# Patient Record
Sex: Male | Born: 1995 | Race: Black or African American | Hispanic: No | Marital: Single | Smoking: Current every day smoker
Health system: Southern US, Community
[De-identification: ages and names within clinical notes are randomized; demographics above are authoritative.]

---

## 2016-09-25 ENCOUNTER — Encounter (HOSPITAL_COMMUNITY): Admission: EM | Disposition: A | Discharge status: Home / Self Care | Payer: Self-pay | Source: Home / Self Care

## 2016-09-25 ENCOUNTER — Inpatient Hospital Stay (HOSPITAL_COMMUNITY): Payer: Medicaid Other | Admitting: Anesthesiology

## 2016-09-25 ENCOUNTER — Encounter (HOSPITAL_COMMUNITY): Payer: Self-pay | Admitting: *Deleted

## 2016-09-25 ENCOUNTER — Inpatient Hospital Stay (HOSPITAL_COMMUNITY): Payer: Medicaid Other

## 2016-09-25 ENCOUNTER — Inpatient Hospital Stay (HOSPITAL_COMMUNITY)
Admission: EM | Admit: 2016-09-25 | Discharge: 2016-09-26 | DRG: 482 | Disposition: A | Discharge status: Home / Self Care | Payer: Medicaid Other | Attending: General Surgery | Admitting: General Surgery

## 2016-09-25 ENCOUNTER — Emergency Department (HOSPITAL_COMMUNITY): Payer: Medicaid Other

## 2016-09-25 DIAGNOSIS — T148XXA Other injury of unspecified body region, initial encounter: Secondary | ICD-10-CM

## 2016-09-25 DIAGNOSIS — S72402B Unspecified fracture of lower end of left femur, initial encounter for open fracture type I or II: Secondary | ICD-10-CM | POA: Diagnosis present

## 2016-09-25 DIAGNOSIS — R2681 Unsteadiness on feet: Secondary | ICD-10-CM

## 2016-09-25 DIAGNOSIS — F17200 Nicotine dependence, unspecified, uncomplicated: Secondary | ICD-10-CM | POA: Diagnosis present

## 2016-09-25 DIAGNOSIS — M79605 Pain in left leg: Secondary | ICD-10-CM | POA: Diagnosis present

## 2016-09-25 DIAGNOSIS — W3400XA Accidental discharge from unspecified firearms or gun, initial encounter: Secondary | ICD-10-CM

## 2016-09-25 DIAGNOSIS — S71132A Puncture wound without foreign body, left thigh, initial encounter: Secondary | ICD-10-CM

## 2016-09-25 DIAGNOSIS — S7292XB Unspecified fracture of left femur, initial encounter for open fracture type I or II: Secondary | ICD-10-CM | POA: Diagnosis present

## 2016-09-25 DIAGNOSIS — S7292XA Unspecified fracture of left femur, initial encounter for closed fracture: Secondary | ICD-10-CM

## 2016-09-25 HISTORY — PX: FEMUR IM NAIL: SHX1597

## 2016-09-25 LAB — CBC WITH DIFFERENTIAL/PLATELET
BASOS PCT: 0 %
Basophils Absolute: 0 10*3/uL (ref 0.0–0.1)
EOS ABS: 0.1 10*3/uL (ref 0.0–0.7)
EOS PCT: 0 %
HCT: 43.1 % (ref 39.0–52.0)
HEMOGLOBIN: 14.8 g/dL (ref 13.0–17.0)
Lymphocytes Relative: 41 %
Lymphs Abs: 5.3 10*3/uL — ABNORMAL HIGH (ref 0.7–4.0)
MCH: 34.3 pg — ABNORMAL HIGH (ref 26.0–34.0)
MCHC: 34.3 g/dL (ref 30.0–36.0)
MCV: 100 fL (ref 78.0–100.0)
Monocytes Absolute: 0.8 10*3/uL (ref 0.1–1.0)
Monocytes Relative: 6 %
NEUTROS PCT: 53 %
Neutro Abs: 6.8 10*3/uL (ref 1.7–7.7)
PLATELETS: 191 10*3/uL (ref 150–400)
RBC: 4.31 MIL/uL (ref 4.22–5.81)
RDW: 11.6 % (ref 11.5–15.5)
WBC: 13 10*3/uL — AB (ref 4.0–10.5)

## 2016-09-25 LAB — CBC
HEMATOCRIT: 39.8 % (ref 39.0–52.0)
Hemoglobin: 13.7 g/dL (ref 13.0–17.0)
MCH: 34.1 pg — ABNORMAL HIGH (ref 26.0–34.0)
MCHC: 34.4 g/dL (ref 30.0–36.0)
MCV: 99 fL (ref 78.0–100.0)
PLATELETS: 166 10*3/uL (ref 150–400)
RBC: 4.02 MIL/uL — AB (ref 4.22–5.81)
RDW: 11.6 % (ref 11.5–15.5)
WBC: 20.4 10*3/uL — AB (ref 4.0–10.5)

## 2016-09-25 LAB — PREPARE FRESH FROZEN PLASMA
UNIT DIVISION: 0
UNIT DIVISION: 0

## 2016-09-25 LAB — TYPE AND SCREEN
ABO/RH(D): O POS
Antibody Screen: NEGATIVE
UNIT DIVISION: 0
Unit division: 0

## 2016-09-25 LAB — BASIC METABOLIC PANEL
Anion gap: 11 (ref 5–15)
Anion gap: 10 (ref 5–15)
BUN: <5 mg/dL — ABNORMAL LOW (ref 6–20)
BUN: 7 mg/dL (ref 6–20)
CALCIUM: 9.1 mg/dL (ref 8.9–10.3)
CHLORIDE: 103 mmol/L (ref 101–111)
CO2: 25 mmol/L (ref 22–32)
CO2: 25 mmol/L (ref 22–32)
CREATININE: 1.16 mg/dL (ref 0.61–1.24)
Calcium: 9.2 mg/dL (ref 8.9–10.3)
Chloride: 104 mmol/L (ref 101–111)
Creatinine, Ser: 1.03 mg/dL (ref 0.61–1.24)
GLUCOSE: 108 mg/dL — AB (ref 65–99)
Glucose, Bld: 152 mg/dL — ABNORMAL HIGH (ref 65–99)
POTASSIUM: 3 mmol/L — AB (ref 3.5–5.1)
POTASSIUM: 3.6 mmol/L (ref 3.5–5.1)
SODIUM: 139 mmol/L (ref 135–145)
Sodium: 139 mmol/L (ref 135–145)

## 2016-09-25 LAB — ABO/RH: ABO/RH(D): O POS

## 2016-09-25 SURGERY — INSERTION, INTRAMEDULLARY ROD, FEMUR, RETROGRADE
Anesthesia: Regional | Site: Leg Upper | Laterality: Left

## 2016-09-25 MED ORDER — LIDOCAINE HCL (CARDIAC) 20 MG/ML IV SOLN
INTRAVENOUS | Status: DC | PRN
Start: 2016-09-25 — End: 2016-09-25
  Administered 2016-09-25: 60 mg via INTRAVENOUS

## 2016-09-25 MED ORDER — ACETAMINOPHEN 160 MG/5ML PO SOLN: 325.0000 mg | ORAL | Status: DC | PRN

## 2016-09-25 MED ORDER — FENTANYL CITRATE (PF) 100 MCG/2ML IJ SOLN
INTRAMUSCULAR | Status: AC
  Filled 2016-09-25: qty 4

## 2016-09-25 MED ORDER — SUGAMMADEX SODIUM 200 MG/2ML IV SOLN
INTRAVENOUS | Status: AC
  Filled 2016-09-25: qty 2

## 2016-09-25 MED ORDER — FAMOTIDINE IN NACL 20-0.9 MG/50ML-% IV SOLN
20.0000 mg | Freq: Every day | INTRAVENOUS | Status: DC
  Filled 2016-09-25 (×2): qty 50

## 2016-09-25 MED ORDER — ONDANSETRON HCL 4 MG/2ML IJ SOLN
INTRAMUSCULAR | Status: AC
  Filled 2016-09-25: qty 2

## 2016-09-25 MED ORDER — CEFAZOLIN IN D5W 1 GM/50ML IV SOLN
1.0000 g | Freq: Four times a day (QID) | INTRAVENOUS | Status: AC
  Administered 2016-09-25 – 2016-09-26 (×3): 1 g via INTRAVENOUS
  Filled 2016-09-25 (×3): qty 50

## 2016-09-25 MED ORDER — ONDANSETRON HCL 4 MG/2ML IJ SOLN
INTRAMUSCULAR | Status: DC | PRN
Start: 2016-09-25 — End: 2016-09-25
  Administered 2016-09-25: 4 mg via INTRAVENOUS

## 2016-09-25 MED ORDER — MIDAZOLAM HCL 2 MG/2ML IJ SOLN
INTRAMUSCULAR | Status: DC | PRN
  Administered 2016-09-25: 2 mg via INTRAVENOUS

## 2016-09-25 MED ORDER — ACETAMINOPHEN 325 MG PO TABS: 325.0000 mg | ORAL_TABLET | ORAL | Status: DC | PRN

## 2016-09-25 MED ORDER — SODIUM CHLORIDE 0.9 % IV SOLN
INTRAVENOUS | Status: DC
  Administered 2016-09-25 (×2): via INTRAVENOUS

## 2016-09-25 MED ORDER — ONDANSETRON HCL 4 MG PO TABS: 4.0000 mg | ORAL_TABLET | Freq: Four times a day (QID) | ORAL | Status: DC | PRN

## 2016-09-25 MED ORDER — METHOCARBAMOL 1000 MG/10ML IJ SOLN
500.0000 mg | Freq: Four times a day (QID) | INTRAVENOUS | Status: DC | PRN
  Filled 2016-09-25: qty 5

## 2016-09-25 MED ORDER — TETANUS-DIPHTH-ACELL PERTUSSIS 5-2.5-18.5 LF-MCG/0.5 IM SUSP
0.5000 mL | Freq: Once | INTRAMUSCULAR | Status: AC
  Administered 2016-09-25: 0.5 mL via INTRAMUSCULAR

## 2016-09-25 MED ORDER — BUPIVACAINE-EPINEPHRINE (PF) 0.5% -1:200000 IJ SOLN
INTRAMUSCULAR | Status: AC
  Filled 2016-09-25: qty 30

## 2016-09-25 MED ORDER — CEFAZOLIN IN D5W 1 GM/50ML IV SOLN
1.0000 g | Freq: Three times a day (TID) | INTRAVENOUS | Status: DC
  Administered 2016-09-25: 1 g via INTRAVENOUS
  Filled 2016-09-25 (×3): qty 50

## 2016-09-25 MED ORDER — SUCCINYLCHOLINE CHLORIDE 200 MG/10ML IV SOSY
PREFILLED_SYRINGE | INTRAVENOUS | Status: AC
  Filled 2016-09-25: qty 10

## 2016-09-25 MED ORDER — ROCURONIUM BROMIDE 100 MG/10ML IV SOLN
INTRAVENOUS | Status: DC | PRN
  Administered 2016-09-25: 40 mg via INTRAVENOUS

## 2016-09-25 MED ORDER — CEFAZOLIN SODIUM-DEXTROSE 2-4 GM/100ML-% IV SOLN
2.0000 g | Freq: Once | INTRAVENOUS | Status: AC
  Administered 2016-09-25: 2 g via INTRAVENOUS

## 2016-09-25 MED ORDER — DOCUSATE SODIUM 100 MG PO CAPS
100.0000 mg | ORAL_CAPSULE | Freq: Two times a day (BID) | ORAL | Status: DC
  Administered 2016-09-25 – 2016-09-26 (×2): 100 mg via ORAL
  Filled 2016-09-25 (×3): qty 1

## 2016-09-25 MED ORDER — LACTATED RINGERS IV SOLN
INTRAVENOUS | Status: DC
  Administered 2016-09-25 (×2): via INTRAVENOUS

## 2016-09-25 MED ORDER — FENTANYL CITRATE (PF) 100 MCG/2ML IJ SOLN
INTRAMUSCULAR | Status: DC | PRN
  Administered 2016-09-25 (×3): 100 ug via INTRAVENOUS

## 2016-09-25 MED ORDER — PANTOPRAZOLE SODIUM 40 MG PO TBEC
40.0000 mg | DELAYED_RELEASE_TABLET | Freq: Every day | ORAL | Status: DC
  Administered 2016-09-26: 40 mg via ORAL
  Filled 2016-09-25: qty 1

## 2016-09-25 MED ORDER — HYDROMORPHONE HCL 1 MG/ML IJ SOLN
0.5000 mg | INTRAMUSCULAR | Status: DC | PRN
  Administered 2016-09-25: 1 mg via INTRAVENOUS
  Filled 2016-09-25: qty 1

## 2016-09-25 MED ORDER — OXYCODONE HCL 5 MG/5ML PO SOLN: 5.0000 mg | Freq: Once | ORAL | Status: DC | PRN

## 2016-09-25 MED ORDER — HYDROMORPHONE HCL 1 MG/ML IJ SOLN: 0.2500 mg | INTRAMUSCULAR | Status: DC | PRN

## 2016-09-25 MED ORDER — ONDANSETRON HCL 4 MG/2ML IJ SOLN: 4.0000 mg | Freq: Four times a day (QID) | INTRAMUSCULAR | Status: DC | PRN

## 2016-09-25 MED ORDER — PROPOFOL 10 MG/ML IV BOLUS
INTRAVENOUS | Status: AC
  Filled 2016-09-25: qty 20

## 2016-09-25 MED ORDER — CEFAZOLIN SODIUM-DEXTROSE 2-4 GM/100ML-% IV SOLN
2.0000 g | INTRAVENOUS | Status: AC
  Administered 2016-09-25: 2 g via INTRAVENOUS

## 2016-09-25 MED ORDER — ACETAMINOPHEN 325 MG PO TABS
650.0000 mg | ORAL_TABLET | Freq: Four times a day (QID) | ORAL | Status: AC
  Administered 2016-09-25 – 2016-09-26 (×3): 650 mg via ORAL
  Filled 2016-09-25 (×4): qty 2

## 2016-09-25 MED ORDER — OXYCODONE HCL 5 MG PO TABS: 5.0000 mg | ORAL_TABLET | Freq: Once | ORAL | Status: DC | PRN

## 2016-09-25 MED ORDER — SUGAMMADEX SODIUM 200 MG/2ML IV SOLN
INTRAVENOUS | Status: DC | PRN
  Administered 2016-09-25: 100 mg via INTRAVENOUS

## 2016-09-25 MED ORDER — BUPIVACAINE-EPINEPHRINE (PF) 0.5% -1:200000 IJ SOLN
INTRAMUSCULAR | Status: DC | PRN
Start: 2016-09-25 — End: 2016-09-25
  Administered 2016-09-25: 24 mL via PERINEURAL

## 2016-09-25 MED ORDER — ACETAMINOPHEN 325 MG PO TABS
650.0000 mg | ORAL_TABLET | Freq: Four times a day (QID) | ORAL | Status: DC | PRN
  Administered 2016-09-26: 650 mg via ORAL

## 2016-09-25 MED ORDER — METOCLOPRAMIDE HCL 5 MG/ML IJ SOLN: 5.0000 mg | Freq: Three times a day (TID) | INTRAMUSCULAR | Status: DC | PRN

## 2016-09-25 MED ORDER — LIDOCAINE 2% (20 MG/ML) 5 ML SYRINGE
INTRAMUSCULAR | Status: AC
  Filled 2016-09-25: qty 5

## 2016-09-25 MED ORDER — PROPOFOL 10 MG/ML IV BOLUS
INTRAVENOUS | Status: DC | PRN
  Administered 2016-09-25: 150 mg via INTRAVENOUS

## 2016-09-25 MED ORDER — FENTANYL CITRATE (PF) 100 MCG/2ML IJ SOLN
INTRAMUSCULAR | Status: AC
  Filled 2016-09-25: qty 2

## 2016-09-25 MED ORDER — ROCURONIUM BROMIDE 10 MG/ML (PF) SYRINGE
PREFILLED_SYRINGE | INTRAVENOUS | Status: AC
  Filled 2016-09-25: qty 10

## 2016-09-25 MED ORDER — ONDANSETRON HCL 4 MG PO TABS: 4.0000 mg | ORAL_TABLET | Freq: Three times a day (TID) | ORAL | 0 refills | Status: AC | PRN

## 2016-09-25 MED ORDER — TETANUS-DIPHTH-ACELL PERTUSSIS 5-2.5-18.5 LF-MCG/0.5 IM SUSP
INTRAMUSCULAR | Status: AC
  Filled 2016-09-25: qty 0.5

## 2016-09-25 MED ORDER — CHLORHEXIDINE GLUCONATE 4 % EX LIQD: 60.0000 mL | Freq: Once | CUTANEOUS | Status: DC

## 2016-09-25 MED ORDER — METHOCARBAMOL 500 MG PO TABS: 500.0000 mg | ORAL_TABLET | Freq: Four times a day (QID) | ORAL | 0 refills | Status: AC | PRN

## 2016-09-25 MED ORDER — MORPHINE SULFATE (PF) 4 MG/ML IV SOLN: 4.0000 mg | Freq: Once | INTRAVENOUS | Status: DC

## 2016-09-25 MED ORDER — OXYCODONE HCL 5 MG PO TABS
5.0000 mg | ORAL_TABLET | ORAL | Status: DC | PRN
  Administered 2016-09-25 – 2016-09-26 (×6): 10 mg via ORAL
  Filled 2016-09-25 (×7): qty 2

## 2016-09-25 MED ORDER — METHOCARBAMOL 500 MG PO TABS
500.0000 mg | ORAL_TABLET | Freq: Four times a day (QID) | ORAL | Status: DC | PRN
  Administered 2016-09-25 – 2016-09-26 (×3): 500 mg via ORAL
  Filled 2016-09-25 (×3): qty 1

## 2016-09-25 MED ORDER — ENOXAPARIN SODIUM 40 MG/0.4ML ~~LOC~~ SOLN
40.0000 mg | SUBCUTANEOUS | Status: DC
  Administered 2016-09-25: 40 mg via SUBCUTANEOUS
  Filled 2016-09-25: qty 0.4

## 2016-09-25 MED ORDER — OXYCODONE-ACETAMINOPHEN 5-325 MG PO TABS: 1.0000 | ORAL_TABLET | ORAL | 0 refills | Status: AC | PRN

## 2016-09-25 MED ORDER — CEFAZOLIN SODIUM-DEXTROSE 2-4 GM/100ML-% IV SOLN
INTRAVENOUS | Status: AC
  Filled 2016-09-25: qty 100

## 2016-09-25 MED ORDER — ASPIRIN EC 325 MG PO TBEC: 325.0000 mg | DELAYED_RELEASE_TABLET | Freq: Every day | ORAL | 0 refills | Status: AC

## 2016-09-25 MED ORDER — LIDOCAINE-EPINEPHRINE 2 %-1:100000 IJ SOLN
INTRAMUSCULAR | Status: DC | PRN
  Administered 2016-09-25: 6 mL via PERINEURAL

## 2016-09-25 MED ORDER — POVIDONE-IODINE 10 % EX SWAB: 2.0000 "application " | Freq: Once | CUTANEOUS | Status: DC

## 2016-09-25 MED ORDER — METOCLOPRAMIDE HCL 5 MG PO TABS: 5.0000 mg | ORAL_TABLET | Freq: Three times a day (TID) | ORAL | Status: DC | PRN

## 2016-09-25 MED ORDER — MIDAZOLAM HCL 2 MG/2ML IJ SOLN
INTRAMUSCULAR | Status: AC
  Filled 2016-09-25: qty 2

## 2016-09-25 MED ORDER — KETOROLAC TROMETHAMINE 15 MG/ML IJ SOLN
15.0000 mg | Freq: Four times a day (QID) | INTRAMUSCULAR | Status: AC
  Administered 2016-09-25 – 2016-09-26 (×4): 15 mg via INTRAVENOUS
  Filled 2016-09-25 (×3): qty 1

## 2016-09-25 MED ORDER — ACETAMINOPHEN 650 MG RE SUPP
650.0000 mg | Freq: Four times a day (QID) | RECTAL | Status: DC | PRN
Start: 2016-09-25 — End: 2016-09-26

## 2016-09-25 MED ORDER — DOCUSATE SODIUM 100 MG PO CAPS: 100.0000 mg | ORAL_CAPSULE | Freq: Two times a day (BID) | ORAL | 0 refills | Status: AC

## 2016-09-25 SURGICAL SUPPLY — 66 items
BANDAGE ACE 4X5 VEL STRL LF (GAUZE/BANDAGES/DRESSINGS) ×2 IMPLANT
BANDAGE ACE 6X5 VEL STRL LF (GAUZE/BANDAGES/DRESSINGS) ×2 IMPLANT
BANDAGE ESMARK 6X9 LF (GAUZE/BANDAGES/DRESSINGS) ×1 IMPLANT
BENZOIN TINCTURE PRP APPL 2/3 (GAUZE/BANDAGES/DRESSINGS) ×2 IMPLANT
BIT DRILL CALIBRATED 4.3MMX365 (DRILL) ×1 IMPLANT
BIT DRILL CROWE PNT TWST 4.5MM (DRILL) ×1 IMPLANT
BLADE SURG ROTATE 9660 (MISCELLANEOUS) ×2 IMPLANT
BNDG COHESIVE 6X5 TAN STRL LF (GAUZE/BANDAGES/DRESSINGS) ×4 IMPLANT
BNDG ELASTIC 6X15 VLCR STRL LF (GAUZE/BANDAGES/DRESSINGS) ×2 IMPLANT
BNDG ESMARK 6X9 LF (GAUZE/BANDAGES/DRESSINGS) ×2
BNDG GAUZE ELAST 4 BULKY (GAUZE/BANDAGES/DRESSINGS) ×6 IMPLANT
COVER SURGICAL LIGHT HANDLE (MISCELLANEOUS) ×4 IMPLANT
CUFF TOURNIQUET SINGLE 34IN LL (TOURNIQUET CUFF) IMPLANT
DRAPE C-ARM 42X72 X-RAY (DRAPES) ×2 IMPLANT
DRAPE HALF SHEET 40X57 (DRAPES) ×2 IMPLANT
DRAPE IMP U-DRAPE 54X76 (DRAPES) ×2 IMPLANT
DRAPE ORTHO SPLIT 77X108 STRL (DRAPES) ×2
DRAPE PROXIMA HALF (DRAPES) ×4 IMPLANT
DRAPE SURG ORHT 6 SPLT 77X108 (DRAPES) ×2 IMPLANT
DRAPE U-SHAPE 47X51 STRL (DRAPES) ×2 IMPLANT
DRILL CALIBRATED 4.3MMX365 (DRILL) ×2
DRILL CROWE POINT TWIST 4.5MM (DRILL) ×2
DRSG ADAPTIC 3X8 NADH LF (GAUZE/BANDAGES/DRESSINGS) ×8 IMPLANT
DRSG MEPILEX BORDER 4X4 (GAUZE/BANDAGES/DRESSINGS) ×4 IMPLANT
DRSG MEPILEX BORDER 4X8 (GAUZE/BANDAGES/DRESSINGS) ×2 IMPLANT
DRSG PAD ABDOMINAL 8X10 ST (GAUZE/BANDAGES/DRESSINGS) ×6 IMPLANT
DURAPREP 26ML APPLICATOR (WOUND CARE) ×2 IMPLANT
ELECT REM PT RETURN 9FT ADLT (ELECTROSURGICAL) ×2
ELECTRODE REM PT RTRN 9FT ADLT (ELECTROSURGICAL) ×1 IMPLANT
GLOVE BIO SURGEON STRL SZ7.5 (GLOVE) ×4 IMPLANT
GLOVE BIOGEL PI IND STRL 8 (GLOVE) ×2 IMPLANT
GLOVE BIOGEL PI INDICATOR 8 (GLOVE) ×2
GOWN STRL REUS W/ TWL LRG LVL3 (GOWN DISPOSABLE) ×2 IMPLANT
GOWN STRL REUS W/ TWL XL LVL3 (GOWN DISPOSABLE) ×1 IMPLANT
GOWN STRL REUS W/TWL LRG LVL3 (GOWN DISPOSABLE) ×2
GOWN STRL REUS W/TWL XL LVL3 (GOWN DISPOSABLE) ×1
GUIDEWIRE BEAD TIP (WIRE) ×2 IMPLANT
IMMOBILIZER KNEE 22 UNIV (SOFTGOODS) ×2 IMPLANT
KIT BASIN OR (CUSTOM PROCEDURE TRAY) ×2 IMPLANT
KIT ROOM TURNOVER OR (KITS) ×2 IMPLANT
MANIFOLD NEPTUNE II (INSTRUMENTS) ×2 IMPLANT
NAIL FEM RETRO 10.5X340 (Nail) ×2 IMPLANT
NEEDLE 22X1 1/2 (OR ONLY) (NEEDLE) ×4 IMPLANT
NS IRRIG 1000ML POUR BTL (IV SOLUTION) ×2 IMPLANT
PACK GENERAL/GYN (CUSTOM PROCEDURE TRAY) ×2 IMPLANT
PACK UNIVERSAL I (CUSTOM PROCEDURE TRAY) ×2 IMPLANT
PAD ARMBOARD 7.5X6 YLW CONV (MISCELLANEOUS) ×4 IMPLANT
PIN GUIDE 3.2 903003004 (MISCELLANEOUS) ×2 IMPLANT
SCREW CORT TI DBL LEAD 5X30 (Screw) ×2 IMPLANT
SCREW CORT TI DBL LEAD 5X32 (Screw) ×2 IMPLANT
SCREW CORT TI DBL LEAD 5X40 (Screw) ×2 IMPLANT
SCREW CORT TI DBL LEAD 5X44 (Screw) ×4 IMPLANT
SCREW CORT TI DBL LEAD 5X60 (Screw) ×2 IMPLANT
SCREW CORT TI DBLE LEAD 5X52 (Screw) ×2 IMPLANT
SPONGE GAUZE 4X4 12PLY STER LF (GAUZE/BANDAGES/DRESSINGS) ×6 IMPLANT
STOCKINETTE IMPERVIOUS LG (DRAPES) ×2 IMPLANT
SUT ETHILON 3 0 PS 1 (SUTURE) ×4 IMPLANT
SUT MNCRL AB 4-0 PS2 18 (SUTURE) ×2 IMPLANT
SUT MON AB 2-0 CT1 27 (SUTURE) ×2 IMPLANT
SUT VIC AB 0 CT1 27 (SUTURE) ×1
SUT VIC AB 0 CT1 27XBRD ANBCTR (SUTURE) ×1 IMPLANT
SYR CONTROL 10ML LL (SYRINGE) ×4 IMPLANT
TOWEL OR 17X24 6PK STRL BLUE (TOWEL DISPOSABLE) ×2 IMPLANT
TOWEL OR 17X26 10 PK STRL BLUE (TOWEL DISPOSABLE) ×2 IMPLANT
TOWEL OR NON WOVEN STRL DISP B (DISPOSABLE) ×2 IMPLANT
WATER STERILE IRR 1000ML POUR (IV SOLUTION) ×2 IMPLANT

## 2016-09-25 NOTE — Transfer of Care (Signed)
Immediate Anesthesia Transfer of Care Note  Patient: Antonio Anthony  Procedure(s) Performed: Procedure(s): INTRAMEDULLARY (IM) RETROGRADE FEMORAL NAILING (Left)  Patient Location: PACU  Anesthesia Type:General  Level of Consciousness: awake and oriented  Airway & Oxygen Therapy: Patient Spontanous Breathing  Post-op Assessment: Report given to RN  Post vital signs: Reviewed and stable  Last Vitals:  Vitals:   09/25/16 0308 09/25/16 0355  BP: (!) 108/54 124/64  Pulse: 88 87  Resp: 16 18  Temp:  36.4 C    Last Pain:  Vitals:   09/25/16 0421  TempSrc:   PainSc: 2          Complications: No apparent anesthesia complications

## 2016-09-25 NOTE — ED Notes (Signed)
Pt sleeping at present
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
## 2016-09-25 NOTE — ED Notes (Signed)
Report given to rn on 5n 

## 2016-09-25 NOTE — Anesthesia Postprocedure Evaluation (Signed)
Anesthesia Post Note  Patient: Antonio Anthony  Procedure(s) Performed: Procedure(s) (LRB): INTRAMEDULLARY (IM) RETROGRADE FEMORAL NAILING (Left)  Patient location during evaluation: PACU Anesthesia Type: Regional and General Level of consciousness: awake Pain management: pain level controlled Vital Signs Assessment: post-procedure vital signs reviewed and stable Respiratory status: spontaneous breathing Cardiovascular status: stable Postop Assessment: no signs of nausea or vomiting Anesthetic complications: no    Last Vitals:  Vitals:   09/25/16 1200 09/25/16 1204  BP:  (!) 148/93  Pulse: 93 (!) 102  Resp: (!) 28 (!) 22  Temp:  36.4 C    Last Pain:  Vitals:   09/25/16 0421  TempSrc:   PainSc: 2                  Mega Kinkade
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
## 2016-09-25 NOTE — Anesthesia Procedure Notes (Signed)
Procedure Name: Intubation Date/Time: 09/25/2016 9:29 AM Performed by: Jefm MilesENNIE, Dorsie Burich E Pre-anesthesia Checklist: Patient identified, Emergency Drugs available, Suction available and Patient being monitored Patient Re-evaluated:Patient Re-evaluated prior to inductionOxygen Delivery Method: Circle System Utilized Preoxygenation: Pre-oxygenation with 100% oxygen Intubation Type: IV induction Ventilation: Mask ventilation without difficulty Laryngoscope Size: Mac and 3 Grade View: Grade I Tube type: Oral Tube size: 7.5 mm Number of attempts: 1 Airway Equipment and Method: Stylet and Oral airway Placement Confirmation: ETT inserted through vocal cords under direct vision,  positive ETCO2 and breath sounds checked- equal and bilateral Secured at: 21 cm Tube secured with: Tape Dental Injury: Teeth and Oropharynx as per pre-operative assessment
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
## 2016-09-25 NOTE — Progress Notes (Signed)
   09/25/16 0200  Clinical Encounter Type  Visited With Health care provider  Visit Type ED  Spiritual Encounters  Spiritual Needs Emotional  Stress Factors  Patient Stress Factors Not reviewed  Checking for NOK. None listed. Check with Pt.

## 2016-09-25 NOTE — Anesthesia Procedure Notes (Signed)
Anesthesia Regional Block:  Femoral nerve block  Pre-Anesthetic Checklist: ,, timeout performed, Correct Patient, Correct Site, Correct Laterality, Correct Procedure, Correct Position, site marked, Risks and benefits discussed,  Surgical consent,  Pre-op evaluation,  At surgeon's request and post-op pain management  Laterality: Lower and Left  Prep: chloraprep       Needles:  Injection technique: Single-shot  Needle Type: Echogenic Stimulator Needle          Additional Needles:  Procedures: ultrasound guided (picture in chart) Femoral nerve block  Nerve Stimulator or Paresthesia:  Response: 0.5 mA,   Additional Responses:   Narrative:  Injection made incrementally with aspirations every 5 mL.  Performed by: Personally  Anesthesiologist: Robbie Rideaux  Additional Notes: H+P and labs reviewed, risks and benefits discussed with patient, procedure tolerated well without complications

## 2016-09-25 NOTE — Progress Notes (Signed)
Patient wanted to be discharged home now because he wants to be near home and be with his mother and child whom cannot come to see him at the hospital. Patient added, his family lives in [place] and they live 5 mins away from the hospital and he will be ok going home. This information [facility] MD indicated that, patient needs antibiotics and pain management after surgery and cannot be discharged at this time. However, patient is at liberty to live against medical advice [facility]) if patient chooses to do so. This information was communicated to patient, he was not receptive of it but his aunt whom was at the bedside was receptive to the information. Patient aunt acknowledged information and said, patient will stay and will not leave the hospital.

## 2016-09-25 NOTE — ED Notes (Signed)
Morphine 4mg  fiven by emilie rn  t dap im lt deltoid im emilie rn    Ancef   2 gms iv

## 2016-09-25 NOTE — Progress Notes (Signed)
Per patient birth date was listed incorrectly as 1996. Patient reports actual year of birth [id number]. ER registration notified and updated in system

## 2016-09-25 NOTE — Progress Notes (Signed)
Patient ID: Antonio Anthony, male   DOB: May 20, 1996, 20 y.o.   MRN: [id number] In pacu after ortho surgery today.  Doing fine.  Will advance diet today

## 2016-09-25 NOTE — ED Notes (Signed)
Attempted to call pt's mother, sister, and brother. None of the numbers worked.

## 2016-09-26 ENCOUNTER — Inpatient Hospital Stay (HOSPITAL_COMMUNITY): Payer: Medicaid Other

## 2016-09-26 LAB — BLOOD PRODUCT ORDER (VERBAL) VERIFICATION

## 2016-09-26 MED ORDER — OXYCODONE HCL 5 MG PO TABS: 5.0000 mg | ORAL_TABLET | ORAL | 0 refills | Status: AC | PRN

## 2016-09-26 MED ORDER — METHOCARBAMOL 500 MG PO TABS: 500.0000 mg | ORAL_TABLET | Freq: Four times a day (QID) | ORAL | 0 refills | Status: AC | PRN

## 2016-09-26 NOTE — Progress Notes (Signed)
   Assessment: 1 Day Post-Op  S/P Procedure(s) (LRB): INTRAMEDULLARY (IM) RETROGRADE FEMORAL NAILING (Left) by Dr. Jewel Baizeimothy D. Murphy on 09/25/16  Active Problems:   Open femur fracture, left [facility])  Doing well from an orthopedic perspective.  AFVSN, +Urine, Pain controlled.  Plan: Up with therapy Post op xrays today. Weight Bearing: Non Weight Bearing (NWB) left leg - immobilizer full time. Dressings:reinforce prn.  VTE prophylaxis: Lovenox, SCDs, ambulation-  Aspirin x 30 days post op after d/c. Dispo: Likely home after cleared by PT / OT.     Subjective: Patient reports pain as mild. Pain controlled with PO meds.  Tolerating diet.  Urinating.  +Flatus.  No CP, SOB.  Not yet OOB.  Objective:   VITALS:   Vitals:   09/25/16 1204 09/25/16 1411 09/25/16 2022 09/26/16 0409  BP: (!) 148/93 134/81 120/69 121/74  Pulse: (!) 102 82 75 88  Resp: (!) 22 18    Temp: 97.5 F (36.4 C) 98.2 F (36.8 C) 97.6 F (36.4 C) 98.1 F (36.7 C)  TempSrc:  Oral Oral Oral  SpO2: 100% 100% 100% 99%  Weight:      Height:       CBC Latest Ref Rng & Units 09/25/2016 09/25/2016  WBC 4.0 - 10.5 K/uL 20.4(H) 13.0(H)  Hemoglobin 13.0 - 17.0 g/dL 16.113.7 09.614.8  Hematocrit 04.539.0 - 52.0 % 39.8 43.1  Platelets 150 - 400 K/uL 166 191   BMP Latest Ref Rng & Units 09/25/2016 09/25/2016  Glucose 65 - 99 mg/dL 409(W108(H) 119(J152(H)  BUN 6 - 20 mg/dL 7 <4(N<5(L)  Creatinine 8.290.61 - 1.24 mg/dL 5.621.03 1.301.16  Sodium 865135 - 145 mmol/L 139 139  Potassium 3.5 - 5.1 mmol/L 3.6 3.0(L)  Chloride 101 - 111 mmol/L 104 103  CO2 22 - 32 mmol/L 25 25  Calcium 8.9 - 10.3 mg/dL 9.1 9.2   Intake/Output      10/07 0701 - 10/08 0700 10/08 0701 - 10/09 0700   P.O.  240   I.V. (mL/kg) 1000 (16.9)    Total Intake(mL/kg) 1000 (16.9) 240 (4.1)   Urine (mL/kg/hr) 400 (0.3)    Blood 50 (0)    Total Output 450     Net +550 +240          Physical Exam: General: NAD.  Supine in bed.   Resp: No increased wob Cardio: regular rate and rhythm ABD  soft Neurologically intact MSK Sensation intact distally Intact DP pulse  Dorsiflexion/Plantar flexion intact Incision: dressing C/D/I   Antonio Anthony 09/26/2016, 8:07 AM

## 2016-09-26 NOTE — Progress Notes (Signed)
OT Cancellation Note and Discharge  Patient Details Name: Antonio FuseJames XXXCampbell MRN: [id number] DOB: 11-19-1996   Cancelled Treatment:    Reason Eval/Treat Not Completed: OT screened, no needs identified, will sign off. Pt seen by PT and no OT needs identified per PT speaking with pt in their note.   [name], [name] Eva [phone] 09/26/2016, 10:38 AM
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
## 2016-09-26 NOTE — Progress Notes (Signed)
Orthopedic Tech Progress Note Patient Details:  Luna FuseJames XXXCampbell 1996/03/14 [id number]  Ortho Devices Type of Ortho Device: Crutches Ortho Device/Splint Interventions: Adjustment   Saul FordyceJennifer C Natisha Trzcinski 09/26/2016, 10:35 AM
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
## 2016-09-27 ENCOUNTER — Encounter (HOSPITAL_COMMUNITY): Payer: Self-pay | Admitting: Orthopedic Surgery

## 2017-05-16 IMAGING — DX DG FEMUR 2+V*L*
4 series · 4 of 4 positions shown · non-contrast
Comparison: September 25, 2016

CLINICAL DATA: Postoperative evaluation for recent fixation of
femur fracture

EXAM:
LEFT FEMUR 2 VIEWS

[femur ap (1 of 2)]
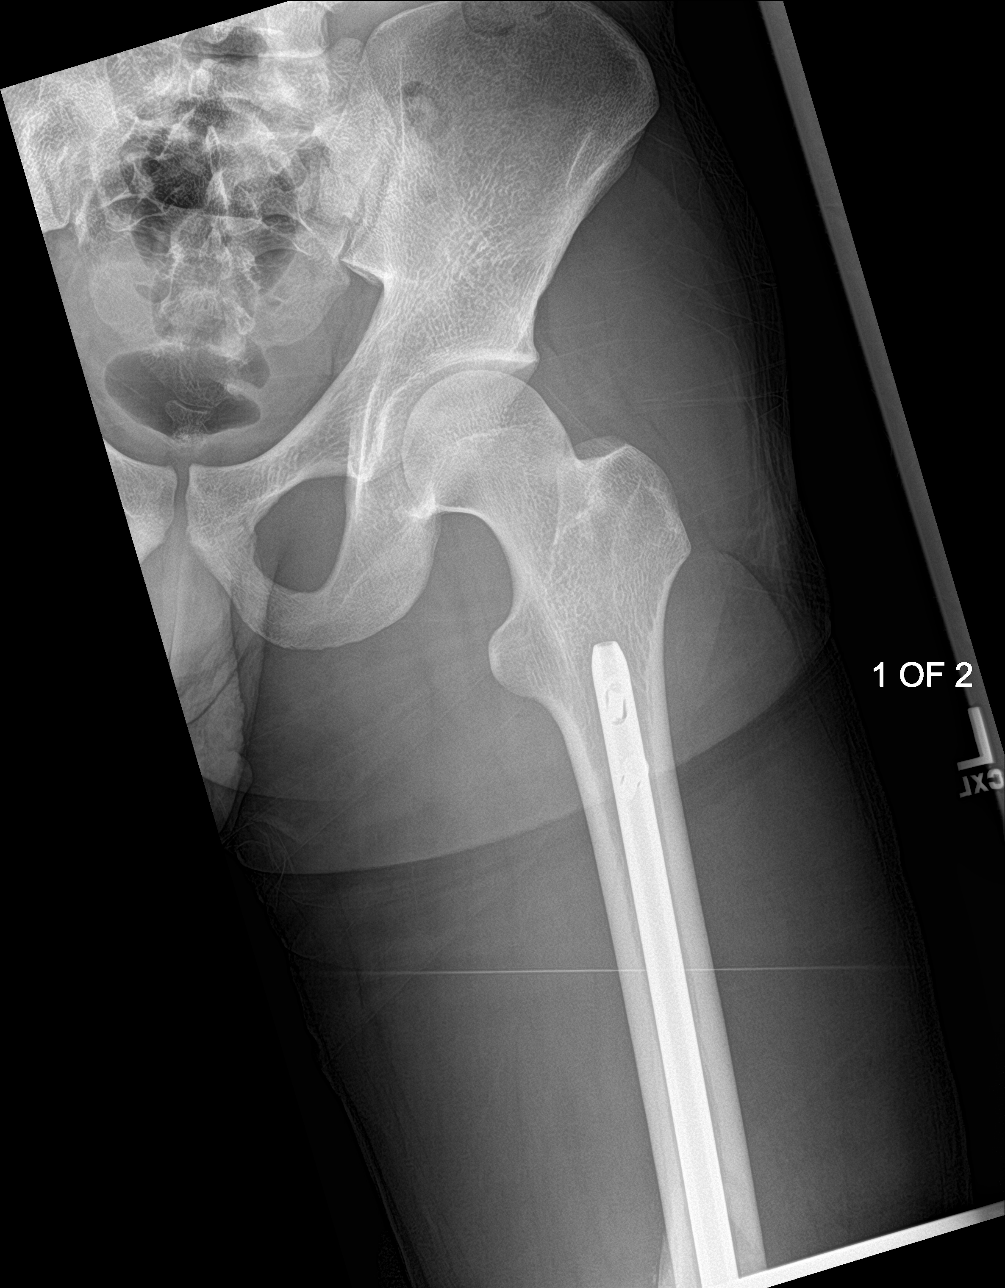

[femur ap (2 of 2)]
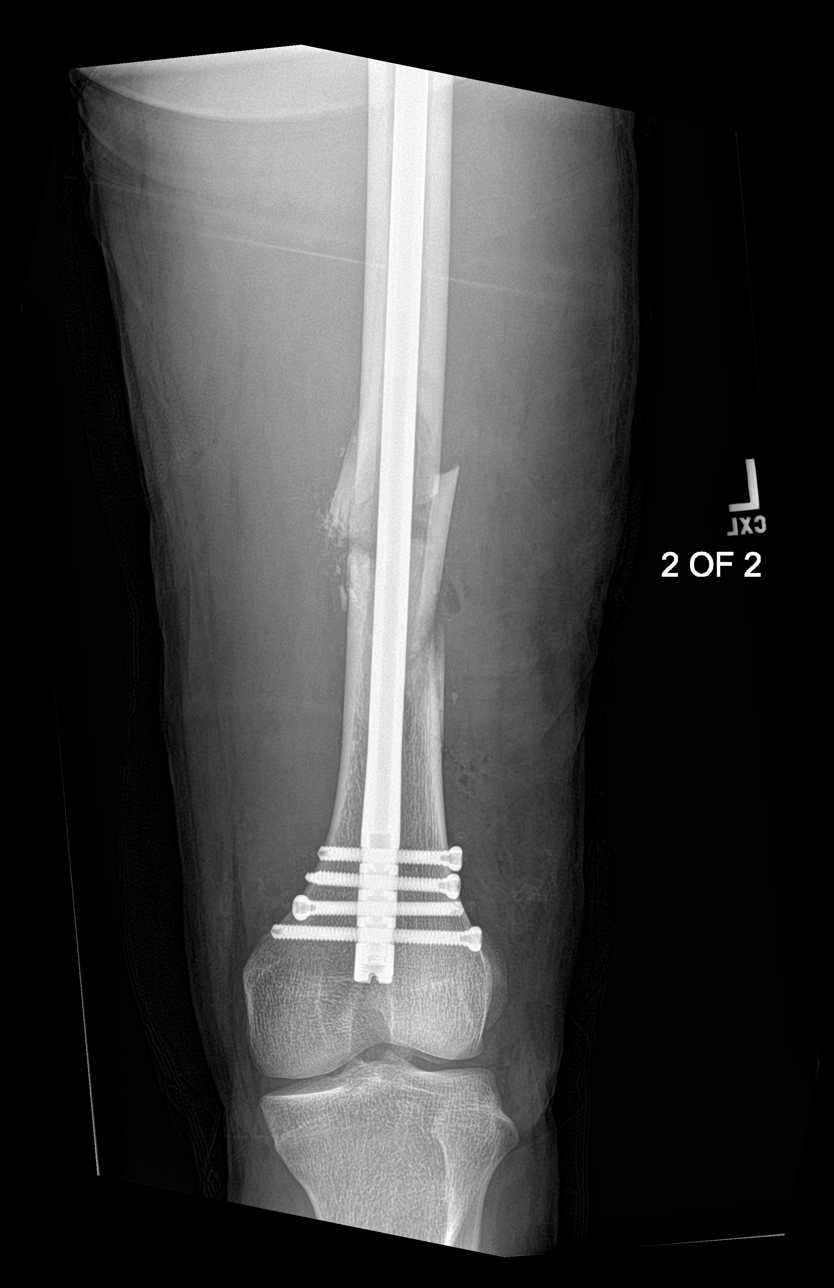

[femur lat (1 of 2)]
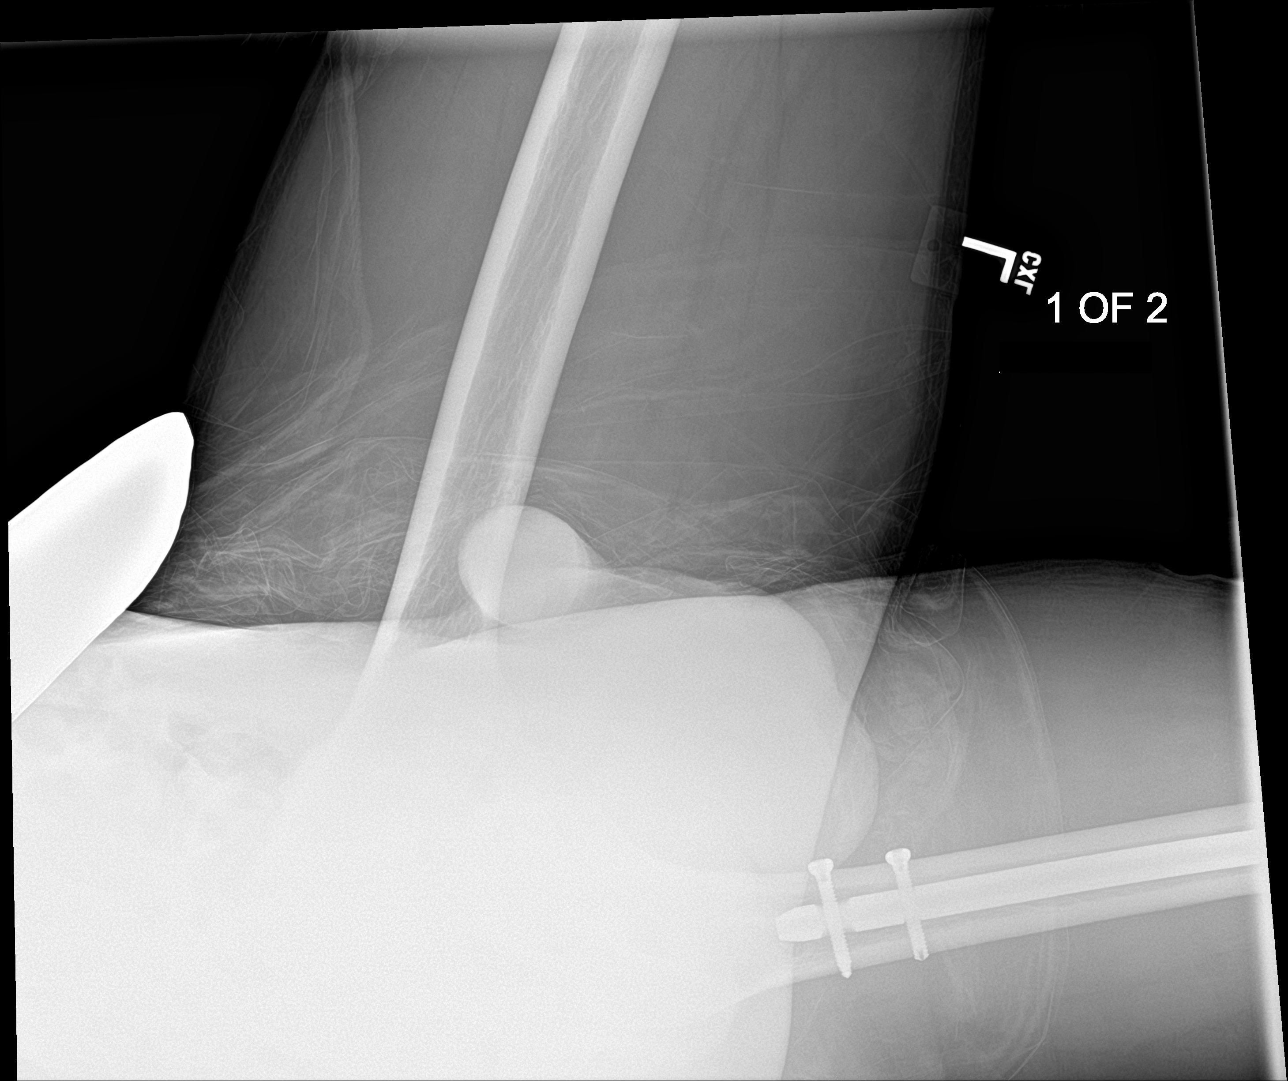

[femur lat (2 of 2)]
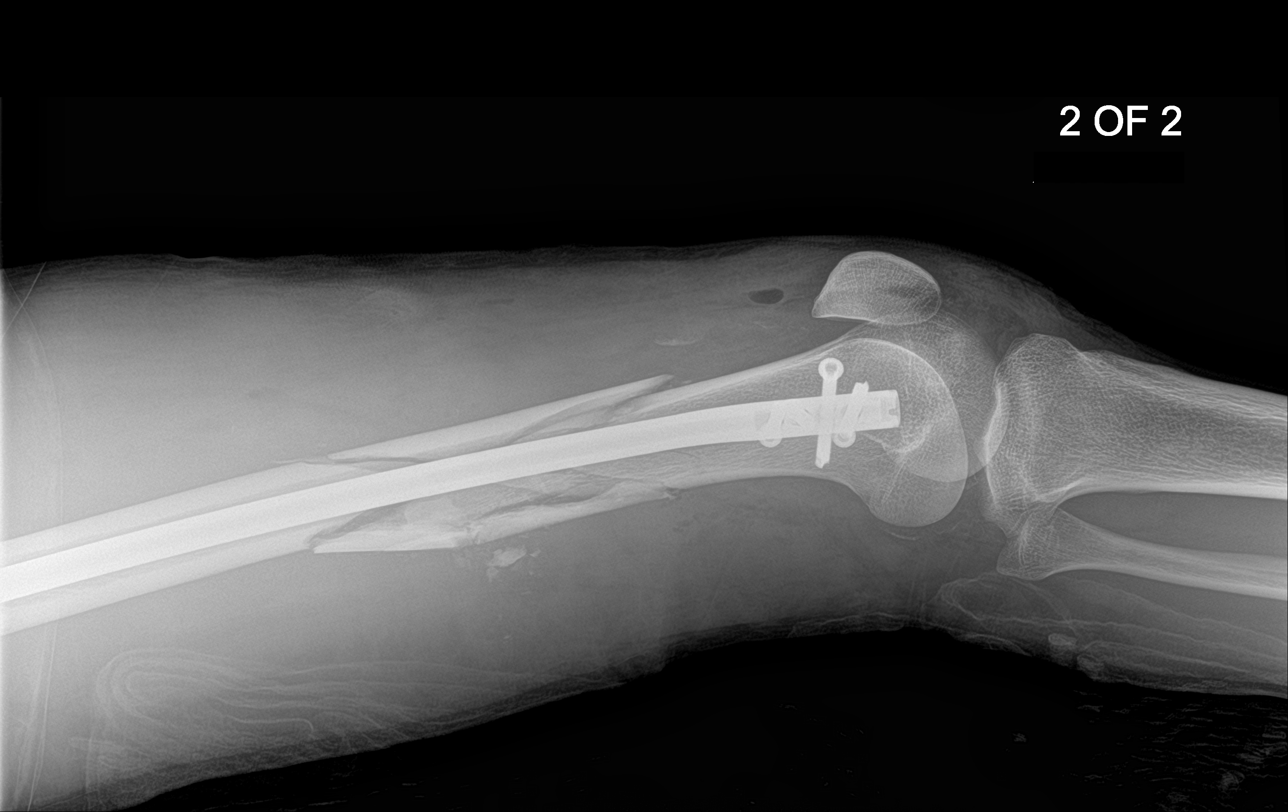

[4 of 4 positions shown; findings below may reference images not displayed]

FINDINGS: Frontal and lateral views were obtained. There is rod and screw
fixation through a comminuted fracture at the junction of mid and
distal thirds of the right femoral diaphysis. Major fracture
fragments are in overall near anatomic alignment. No new fracture.
No dislocation. Joint spaces appear normal. A small amount of air in
the suprapatellar bursa is felt to be of postoperative etiology.
IMPRESSION: Rod and screw fixation through comminuted fracture junction of mid
and distal thirds of finger with major fracture fragments in overall
near anatomic alignment. No new fractures. No dislocation.
